# Patient Record
Sex: Female | Born: 1980 | Race: White | Hispanic: No | Marital: Married | Smoking: Former smoker
Health system: Southern US, Community
[De-identification: ages and names within clinical notes are randomized; demographics above are authoritative.]

## PROBLEM LIST (undated history)

## (undated) DIAGNOSIS — E282 Polycystic ovarian syndrome: Secondary | ICD-10-CM

## (undated) DIAGNOSIS — M545 Low back pain, unspecified: Secondary | ICD-10-CM

## (undated) HISTORY — DX: Low back pain: M54.5

## (undated) HISTORY — DX: Low back pain, unspecified: M54.50

## (undated) HISTORY — DX: Polycystic ovarian syndrome: E28.2

---

## 1998-09-21 ENCOUNTER — Other Ambulatory Visit: Admission: RE | Admit: 1998-09-21 | Discharge: 1998-09-21 | Payer: Self-pay | Admitting: Gynecology

## 2001-05-08 ENCOUNTER — Other Ambulatory Visit: Admission: RE | Admit: 2001-05-08 | Discharge: 2001-05-08 | Payer: Self-pay | Admitting: Gynecology

## 2002-06-16 ENCOUNTER — Other Ambulatory Visit: Admission: RE | Admit: 2002-06-16 | Discharge: 2002-06-16 | Payer: Self-pay | Admitting: Gynecology

## 2003-07-07 ENCOUNTER — Other Ambulatory Visit: Admission: RE | Admit: 2003-07-07 | Discharge: 2003-07-07 | Payer: Self-pay | Admitting: Gynecology

## 2005-11-12 ENCOUNTER — Other Ambulatory Visit: Admission: RE | Admit: 2005-11-12 | Discharge: 2005-11-12 | Payer: Self-pay | Admitting: Gynecology

## 2006-12-02 ENCOUNTER — Other Ambulatory Visit: Admission: RE | Admit: 2006-12-02 | Discharge: 2006-12-02 | Payer: Self-pay | Admitting: Gynecology

## 2007-08-18 ENCOUNTER — Ambulatory Visit: Payer: Self-pay | Admitting: Licensed Clinical Social Worker

## 2007-09-04 ENCOUNTER — Ambulatory Visit: Payer: Self-pay | Admitting: Licensed Clinical Social Worker

## 2007-09-09 ENCOUNTER — Ambulatory Visit: Payer: Self-pay | Admitting: Licensed Clinical Social Worker

## 2008-02-07 ENCOUNTER — Emergency Department (HOSPITAL_COMMUNITY): Admission: EM | Admit: 2008-02-07 | Discharge: 2008-02-07 | Payer: Self-pay | Admitting: Emergency Medicine

## 2013-04-06 ENCOUNTER — Telehealth: Payer: Self-pay

## 2013-04-06 NOTE — Telephone Encounter (Signed)
Patient didn't answer, so a detailed message was left about her appointment being cancelled for tomorrow - December 16 and to re-schedule for another day this week if possible.

## 2013-04-07 ENCOUNTER — Encounter: Payer: Self-pay | Admitting: Neurology

## 2013-04-07 ENCOUNTER — Institutional Professional Consult (permissible substitution): Payer: Managed Care, Other (non HMO) | Admitting: Neurology

## 2013-04-07 NOTE — Telephone Encounter (Signed)
Patient was notified and the appointment was scheduled for April 20, 2013 with Dr. Hosie Poisson.

## 2013-04-20 ENCOUNTER — Encounter (INDEPENDENT_AMBULATORY_CARE_PROVIDER_SITE_OTHER): Payer: Self-pay

## 2013-04-20 ENCOUNTER — Encounter: Payer: Self-pay | Admitting: Neurology

## 2013-04-20 ENCOUNTER — Ambulatory Visit (INDEPENDENT_AMBULATORY_CARE_PROVIDER_SITE_OTHER): Payer: Managed Care, Other (non HMO) | Admitting: Neurology

## 2013-04-20 VITALS — BP 152/92 | HR 105 | Ht 65.0 in | Wt 360.0 lb

## 2013-04-20 DIAGNOSIS — G44309 Post-traumatic headache, unspecified, not intractable: Secondary | ICD-10-CM
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
## 2013-04-20 NOTE — Patient Instructions (Signed)
Overall you are doing fairly well but I do want to suggest a few things today:   Your headaches seem to be most consistent with a post concussion headache. The good news is that these headaches typically improve over time, though it can take 3 to 6 months to fully resolve.   As far as your medications are concerned, I would like to suggest trying a supplement called Petadolex. Please take one capsule twice daily with food.   We will see you back as needed. Please call us with any interim questions, concerns, problems, updates or refill requests.   My clinical assistant and will answer any of your questions and relay your messages to me and also relay most of my messages to you.   Our phone number is [phone]. We also have an after hours call service for urgent matters and there is a physician on-call for urgent questions. For any emergencies you know to call 911 or go to the nearest emergency room

## 2013-10-12 ENCOUNTER — Ambulatory Visit (INDEPENDENT_AMBULATORY_CARE_PROVIDER_SITE_OTHER): Payer: Managed Care, Other (non HMO)

## 2013-10-12 ENCOUNTER — Ambulatory Visit (INDEPENDENT_AMBULATORY_CARE_PROVIDER_SITE_OTHER): Payer: Managed Care, Other (non HMO) | Admitting: Podiatry

## 2013-10-12 ENCOUNTER — Encounter: Payer: Self-pay | Admitting: Podiatry

## 2013-10-12 VITALS — BP 144/86 | HR 92 | Resp 16 | Ht 65.0 in | Wt 368.0 lb

## 2013-10-12 DIAGNOSIS — M722 Plantar fascial fibromatosis: Secondary | ICD-10-CM

## 2013-10-12 MED ORDER — METHYLPREDNISOLONE (PAK) 4 MG PO TABS: ORAL_TABLET | ORAL | Status: DC

## 2013-10-12 MED ORDER — MELOXICAM 15 MG PO TABS: 15.0000 mg | ORAL_TABLET | Freq: Every day | ORAL | Status: DC

## 2013-10-12 NOTE — Progress Notes (Signed)
   Subjective:    Patient ID: Kathryn Moses, female    DOB: 1980-06-07, 33 y.o.   MRN: [id number]  HPI Comments: i have pain in my left heel and a possible ingrown toenail on my left big toe. This has been going on for 9 years off and on. Its better than worse. It hurts after i sit down. i dont go bare foot, wear good shoes, and that's it.  Foot Pain Associated symptoms include headaches.      Review of Systems  Neurological: Positive for headaches.  All other systems reviewed and are negative.      Objective:   Physical Exam: I reviewed her past medical history medications allergies surgeries social history and review of systems. Pulses are palpable bilateral. Neurologic sensorium is intact per Semmes-Weinstein monofilament. Deep tendon reflexes are intact bilateral muscle strength is 5 over 5 dorsiflexors plantar flexors inverters everters all intrinsic musculature is intact. Orthopedic evaluation demonstrates all joints distal to the ankle a full range of motion without crepitation. She has pain on palpation medial calcaneal tubercle left heel. Radiographic evaluation demonstrates relatively normal osseous architecture with a plantar distally oriented calcaneal heel spur and a soft tissue increase in density at the plantar fascial calcaneal insertion site. Cutaneous evaluation demonstrates supple well hydrated cutis there is mild incurvation of the tibial border of the hallux left. No erythema edema drainage cellulitis or odor noted with this.        Assessment & Plan:  Assessment: Plantar fasciitis left.  Plan: Injected the left heel today put her in a plantar fascial strapping. Medrol Dosepak to be followed by medic was called in to her pharmacy. We discussed appropriate shoe gear stretching exercises ice therapy shoe gear modifications. She will continue use a night splint she has a time.

## 2013-11-09 ENCOUNTER — Ambulatory Visit: Payer: Managed Care, Other (non HMO) | Admitting: Podiatry

## 2017-04-22 ENCOUNTER — Ambulatory Visit (INDEPENDENT_AMBULATORY_CARE_PROVIDER_SITE_OTHER): Payer: 59 | Admitting: Urgent Care

## 2017-04-22 ENCOUNTER — Encounter: Payer: Self-pay | Admitting: Urgent Care

## 2017-04-22 VITALS — BP 140/70 | HR 89 | Temp 98.5°F | Resp 16 | Ht 65.0 in | Wt 330.6 lb

## 2017-04-22 DIAGNOSIS — R0981 Nasal congestion: Secondary | ICD-10-CM | POA: Diagnosis not present

## 2017-04-22 DIAGNOSIS — R112 Nausea with vomiting, unspecified: Secondary | ICD-10-CM

## 2017-04-22 DIAGNOSIS — J3489 Other specified disorders of nose and nasal sinuses: Secondary | ICD-10-CM | POA: Diagnosis not present

## 2017-04-22 DIAGNOSIS — E860 Dehydration: Secondary | ICD-10-CM

## 2017-04-22 DIAGNOSIS — R197 Diarrhea, unspecified: Secondary | ICD-10-CM

## 2017-04-22 LAB — POCT CBC
Granulocyte percent: 62.2 %G (ref 37–80)
HEMATOCRIT: 39.8 % (ref 37.7–47.9)
Hemoglobin: 13.2 g/dL (ref 12.2–16.2)
LYMPH, POC: 1.9 (ref 0.6–3.4)
MCH, POC: 26.6 pg — AB (ref 27–31.2)
MCHC: 33.1 g/dL (ref 31.8–35.4)
MCV: 80.1 fL (ref 80–97)
MID (cbc): 0.6 (ref 0–0.9)
MPV: 7.7 fL (ref 0–99.8)
POC GRANULOCYTE: 4.2 (ref 2–6.9)
POC LYMPH %: 28.3 % (ref 10–50)
POC MID %: 9.5 %M (ref 0–12)
Platelet Count, POC: 271 10*3/uL (ref 142–424)
RBC: 4.97 M/uL (ref 4.04–5.48)
RDW, POC: 13.8 %
WBC: 6.8 10*3/uL (ref 4.6–10.2)

## 2017-04-22 LAB — POCT URINALYSIS DIP (MANUAL ENTRY)
Blood, UA: NEGATIVE
GLUCOSE UA: NEGATIVE mg/dL
Ketones, POC UA: NEGATIVE mg/dL
Leukocytes, UA: NEGATIVE
Nitrite, UA: NEGATIVE
Spec Grav, UA: 1.02 (ref 1.010–1.025)
Urobilinogen, UA: >=8 E.U./dL — AB
pH, UA: 7 (ref 5.0–8.0)

## 2017-04-22 MED ORDER — ONDANSETRON 8 MG PO TBDP: 8.0000 mg | ORAL_TABLET | Freq: Three times a day (TID) | ORAL | 0 refills | Status: AC | PRN

## 2017-04-22 MED ORDER — SODIUM CHLORIDE 0.9 % IV SOLN
Freq: Once | INTRAVENOUS | Status: AC
  Administered 2017-04-22: 09:00:00 via INTRAVENOUS

## 2017-04-22 MED ORDER — PSEUDOEPHEDRINE HCL 60 MG PO TABS: 60.0000 mg | ORAL_TABLET | Freq: Three times a day (TID) | ORAL | 0 refills | Status: AC | PRN

## 2017-04-22 MED ORDER — ONDANSETRON HCL 4 MG/2ML IJ SOLN
4.0000 mg | Freq: Once | INTRAMUSCULAR | Status: AC
  Administered 2017-04-22: 4 mg via INTRAVENOUS

## 2017-04-22 NOTE — Progress Notes (Signed)
MRN: [id number] DOB: 1980/09/17  Subjective:   Kathryn Moses is a 36 y.o. female presenting for 3 day history of nausea with vomiting, decreased appetite, is only tolerating liquids. Had sore throat this morning from her vomiting. Has tried Pepto, [employer]met[employer]. Denies fever, abdominal pain, bloody stools or hematemesis. She also had cold symptoms. Reports 1 week history of nasal congestion, started out with sinus pain that has been improving. Denies cough, chest pain, shob. Has tried APAP cold/flu with some relief. Denies smoking cigarettes.  Kathryn Moses is not currently taking any medications and has No Known Allergies.  Kathryn Moses  has a past medical history of Low back pain and PCOS (polycystic ovarian syndrome). Denies past surgical history. Her family history includes Breast cancer in her paternal aunt and paternal grandmother; Cancer in her father, maternal aunt, maternal grandfather, paternal aunt, and paternal grandmother; Diabetes in her maternal aunt and paternal grandmother; Heart Problems in her father and maternal grandfather; Hypercholesterolemia in her father, maternal grandmother, and mother; Other in her father.   Objective:   Vitals: BP 140/70   Pulse 89   Temp 98.5 F (36.9 C) (Oral)   Resp 16   Ht 5\' 5"  (1.651 m)   Wt (!) 330 lb 9.6 oz (150 kg)   SpO2 97%   BMI 55.01 kg/m   Physical Exam  Constitutional: She is oriented to person, place, and time. She appears well-developed and well-nourished.  HENT:  Mouth/Throat: Oropharynx is clear and moist.  Mucous membranes dry.  Eyes: No scleral icterus.  Cardiovascular: Normal rate, regular rhythm and intact distal pulses. Exam reveals no gallop and no friction rub.  No murmur heard. Pulmonary/Chest: No respiratory distress. She has no wheezes. She has no rales.  Abdominal: Soft. Bowel sounds are normal. She exhibits no distension and no mass. There is no tenderness. There is no guarding.  Neurological: She is alert and oriented  to person, place, and time.  Skin: Skin is warm and dry.   Results for orders placed or performed in visit on 04/22/17 (from the past 24 hour(s))  POCT urinalysis dipstick     Status: Abnormal   Collection Time: 04/22/17  8:55 AM  Result Value Ref Range   Color, UA yellow yellow   Clarity, UA clear clear   Glucose, UA negative negative mg/dL   Bilirubin, UA small (A) negative   Ketones, POC UA negative negative mg/dL   Spec Grav, UA 4.0981.020 1.1911.010 - 1.025   Blood, UA negative negative   pH, UA 7.0 5.0 - 8.0   Protein Ur, POC trace (A) negative mg/dL   Urobilinogen, UA >=4.7>=8.0 (A) 0.2 or 1.0 E.U./dL   Nitrite, UA Negative Negative   Leukocytes, UA Negative Negative  POCT CBC     Status: Abnormal   Collection Time: 04/22/17  9:46 AM  Result Value Ref Range   WBC 6.8 4.6 - 10.2 K/uL   Lymph, poc 1.9 0.6 - 3.4   POC LYMPH PERCENT 28.3 10 - 50 %L   MID (cbc) 0.6 0 - 0.9   POC MID % 9.5 0 - 12 %M   POC Granulocyte 4.2 2 - 6.9   Granulocyte percent 62.2 37 - 80 %G   RBC 4.97 4.04 - 5.48 M/uL   Hemoglobin 13.2 12.2 - 16.2 g/dL   HCT, POC 82.939.8 56.237.7 - 47.9 %   MCV 80.1 80 - 97 fL   MCH, POC 26.6 (A) 27 - 31.2 pg   MCHC 33.1 31.8 - 35.4 g/dL  RDW, POC 13.8 %   Platelet Count, POC 271 142 - 424 K/uL   MPV 7.7 0 - 99.8 fL   Assessment and Plan :   Dehydration  Diarrhea, unspecified type - Plan: POCT urinalysis dipstick, Basic metabolic panel, 0.9 %  sodium chloride infusion  Nausea and vomiting, intractability of vomiting not specified, unspecified vomiting type - Plan: Basic metabolic panel, 0.9 %  sodium chloride infusion, ondansetron (ZOFRAN) injection 4 mg  Sinus pain - Plan: POCT CBC  Sinus congestion  Will start supportive care for her gastroenteritis and what I suspect is a concurrent viral URI. Return-to-clinic precautions discussed, patient verbalized understanding.   Wallis BambergMario Broadus Costilla, PA-C Primary Care at [facility] [phone] 04/22/2017  8:43  AM

## 2017-04-22 NOTE — Patient Instructions (Addendum)
Viral Gastroenteritis, Adult Viral gastroenteritis is also known as the stomach flu. This condition is caused by certain germs (viruses). These germs can be passed from person to person very easily (are very contagious). This condition can cause sudden watery poop (diarrhea), fever, and throwing up (vomiting). Having watery poop and throwing up can make you feel weak and cause you to get dehydrated. Dehydration can make you tired and thirsty, make you have a dry mouth, and make it so you pee (urinate) less often. Older adults and people with other diseases or a weak defense system (immune system) are at higher risk for dehydration. It is important to replace the fluids that you lose from having watery poop and throwing up. Follow these instructions at home: Follow instructions from your doctor about how to care for yourself at home. Eating and drinking  Follow these instructions as told by your doctor:  Take an oral rehydration solution (ORS). This is a drink that is sold at pharmacies and stores.  Drink clear fluids in small amounts as you are able, such as: ? Water. ? Ice chips. ? Diluted fruit juice. ? Low-calorie sports drinks.  Eat bland, easy-to-digest foods in small amounts as you are able, such as: ? Bananas. ? Applesauce. ? Rice. ? Low-fat (lean) meats. ? Toast. ? Crackers.  Avoid fluids that have a lot of sugar or caffeine in them.  Avoid alcohol.  Avoid spicy or fatty foods.  General instructions  Drink enough fluid to keep your pee (urine) clear or pale yellow.  Wash your hands often. If you cannot use soap and water, use hand sanitizer.  Make sure that all people in your home wash their hands well and often.  Rest at home while you get better.  Take over-the-counter and prescription medicines only as told by your doctor.  Watch your condition for any changes.  Take a warm bath to help with any burning or pain from having watery poop.  Keep all follow-up  visits as told by your doctor. This is important. Contact a doctor if:  You cannot keep fluids down.  Your symptoms get worse.  You have new symptoms.  You feel light-headed or dizzy.  You have muscle cramps. Get help right away if:  You have chest pain.  You feel very weak or you pass out (faint).  You see blood in your throw-up.  Your throw-up looks like coffee grounds.  You have bloody or black poop (stools) or poop that look like tar.  You have a very bad headache, a stiff neck, or both.  You have a rash.  You have very bad pain, cramping, or bloating in your belly (abdomen).  You have trouble breathing.  You are breathing very quickly.  Your heart is beating very quickly.  Your skin feels cold and clammy.  You feel confused.  You have pain when you pee.  You have signs of dehydration, such as: ? Dark pee, hardly any pee, or no pee. ? Cracked lips. ? Dry mouth. ? Sunken eyes. ? Sleepiness. ? Weakness. This information is not intended to replace advice given to you by your health care provider. Make sure you discuss any questions you have with your health care provider. Document Released: 09/26/2007 Document Revised: 10/28/2015 Document Reviewed: 12/14/2014 Elsevier Interactive Patient Education  2017 Elsevier Inc.     IF you received an x-ray today, you will receive an invoice from [facility]. Please contact [facility] at [phone] with questions or concerns regarding   your invoice.   IF you received labwork today, you will receive an invoice from [facility]. Please contact [facility] at [phone] with questions or concerns regarding your invoice.   Our billing staff will not be able to assist you with questions regarding bills from these companies.  You will be contacted with the lab results as soon as they are available. The fastest way to get your results is to activate your My Chart account. Instructions are located on the  last page of this paperwork. If you have not heard from us regarding the results in 2 weeks, please contact this office.     

## 2017-04-23 LAB — BASIC METABOLIC PANEL
BUN / CREAT RATIO: 15 (ref 9–23)
BUN: 9 mg/dL (ref 6–20)
CO2: 19 mmol/L — ABNORMAL LOW (ref 20–29)
CREATININE: 0.6 mg/dL (ref 0.57–1.00)
Calcium: 8 mg/dL — ABNORMAL LOW (ref 8.7–10.2)
Chloride: 103 mmol/L (ref 96–106)
GFR, EST AFRICAN AMERICAN: 134 mL/min/{1.73_m2} (ref >59)
GFR, EST NON AFRICAN AMERICAN: 116 mL/min/{1.73_m2} (ref >59)
Glucose: 75 mg/dL (ref 65–99)
Potassium: 4 mmol/L (ref 3.5–5.2)
SODIUM: 140 mmol/L (ref 134–144)

## 2018-12-24 DIAGNOSIS — R1032 Left lower quadrant pain: Secondary | ICD-10-CM | POA: Diagnosis not present

## 2020-09-28 ENCOUNTER — Other Ambulatory Visit (HOSPITAL_COMMUNITY): Payer: Self-pay | Admitting: Family Medicine

## 2020-09-28 DIAGNOSIS — R1084 Generalized abdominal pain: Secondary | ICD-10-CM

## 2020-09-28 DIAGNOSIS — M25561 Pain in right knee: Secondary | ICD-10-CM | POA: Diagnosis not present

## 2020-10-05 ENCOUNTER — Ambulatory Visit (HOSPITAL_BASED_OUTPATIENT_CLINIC_OR_DEPARTMENT_OTHER)
Admission: RE | Admit: 2020-10-05 | Discharge: 2020-10-05 | Disposition: A | Discharge status: Home / Self Care | Payer: BC Managed Care – PPO | Source: Ambulatory Visit | Attending: Family Medicine | Admitting: Family Medicine

## 2020-10-05 ENCOUNTER — Other Ambulatory Visit: Payer: Self-pay

## 2020-10-05 DIAGNOSIS — R1084 Generalized abdominal pain: Secondary | ICD-10-CM | POA: Diagnosis not present

## 2020-10-05 DIAGNOSIS — K8020 Calculus of gallbladder without cholecystitis without obstruction: Secondary | ICD-10-CM | POA: Diagnosis not present

## 2021-03-08 DIAGNOSIS — R635 Abnormal weight gain: Secondary | ICD-10-CM | POA: Diagnosis not present

## 2021-03-08 DIAGNOSIS — E282 Polycystic ovarian syndrome: Secondary | ICD-10-CM | POA: Diagnosis not present

## 2021-03-08 DIAGNOSIS — R5383 Other fatigue: Secondary | ICD-10-CM | POA: Diagnosis not present

## 2021-03-14 DIAGNOSIS — N926 Irregular menstruation, unspecified: Secondary | ICD-10-CM | POA: Diagnosis not present

## 2021-03-14 DIAGNOSIS — L918 Other hypertrophic disorders of the skin: Secondary | ICD-10-CM | POA: Diagnosis not present

## 2021-03-14 DIAGNOSIS — Z6841 Body Mass Index (BMI) 40.0 and over, adult: Secondary | ICD-10-CM | POA: Diagnosis not present

## 2021-03-14 DIAGNOSIS — E282 Polycystic ovarian syndrome: Secondary | ICD-10-CM | POA: Diagnosis not present

## 2021-03-14 DIAGNOSIS — Z1231 Encounter for screening mammogram for malignant neoplasm of breast: Secondary | ICD-10-CM | POA: Diagnosis not present

## 2021-03-14 DIAGNOSIS — Z01419 Encounter for gynecological examination (general) (routine) without abnormal findings: Secondary | ICD-10-CM | POA: Diagnosis not present

## 2021-05-03 DIAGNOSIS — Z803 Family history of malignant neoplasm of breast: Secondary | ICD-10-CM | POA: Diagnosis not present

## 2021-08-24 IMAGING — US US ABDOMEN COMPLETE
1 series · 14 of 25 positions shown · non-contrast
Comparison: None.

CLINICAL DATA: Generalized abdominal pain

EXAM:
ABDOMEN ULTRASOUND COMPLETE

[Series 1: us abdomen complete · 14 of 85 slices shown]
[im 1/85]
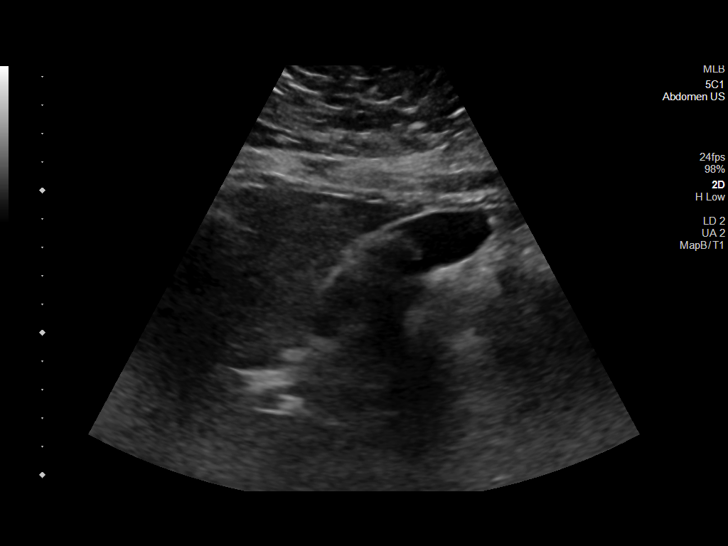
[im 8/85]
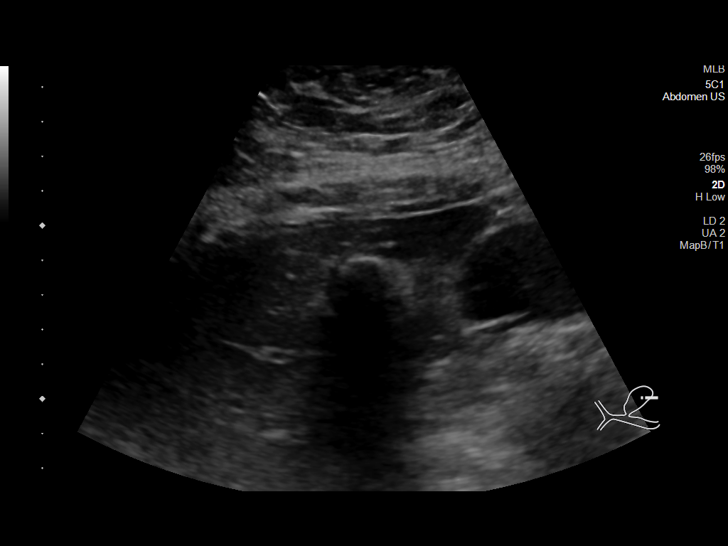
[im 15/85]
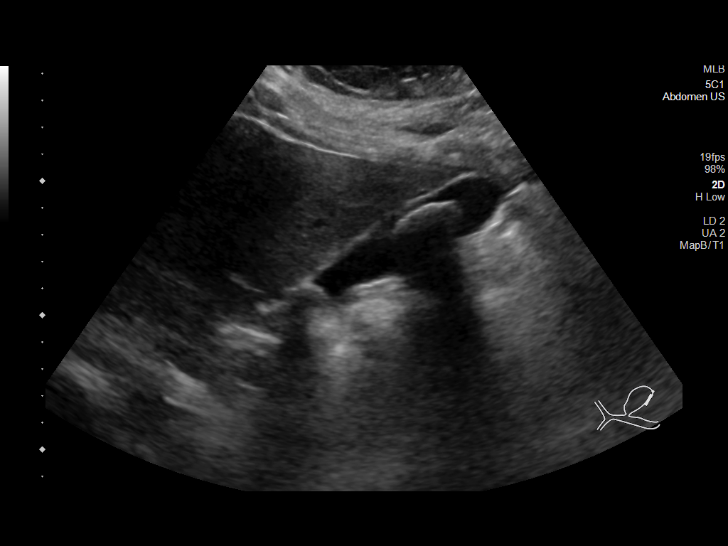
[im 22/85]
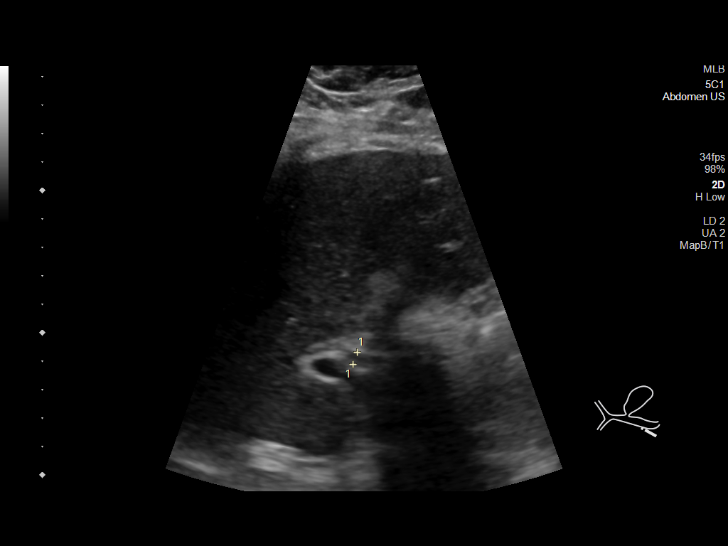
[im 29/85]
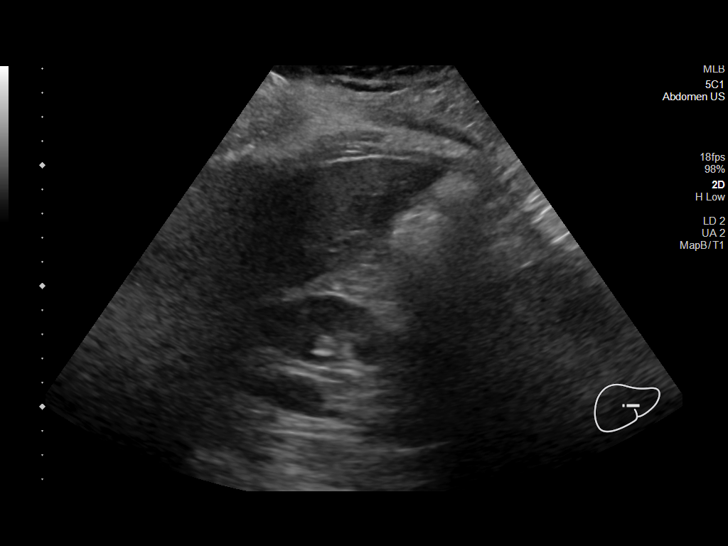
[im 32/85]
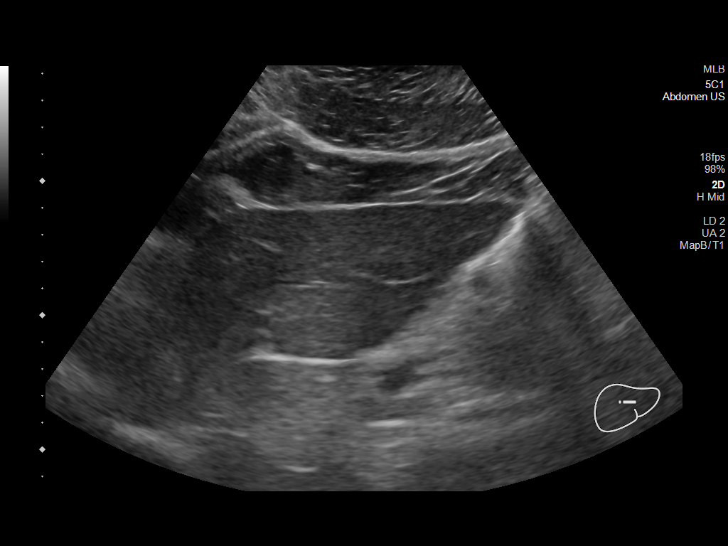
[im 39/85]
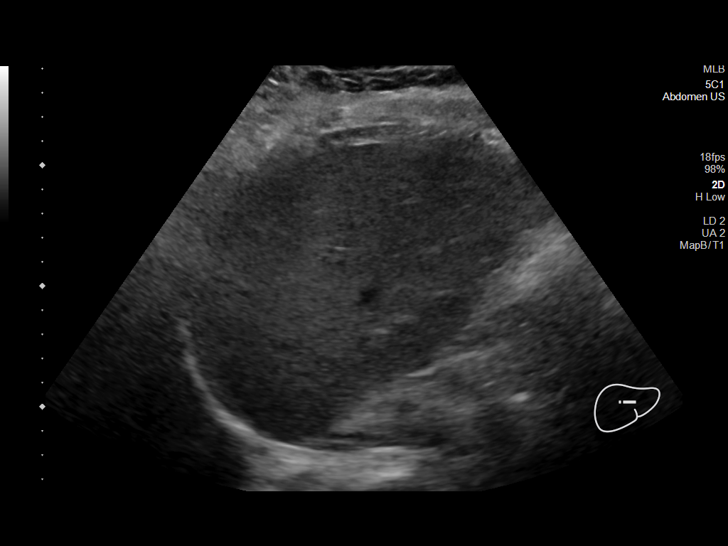
[im 46/85]
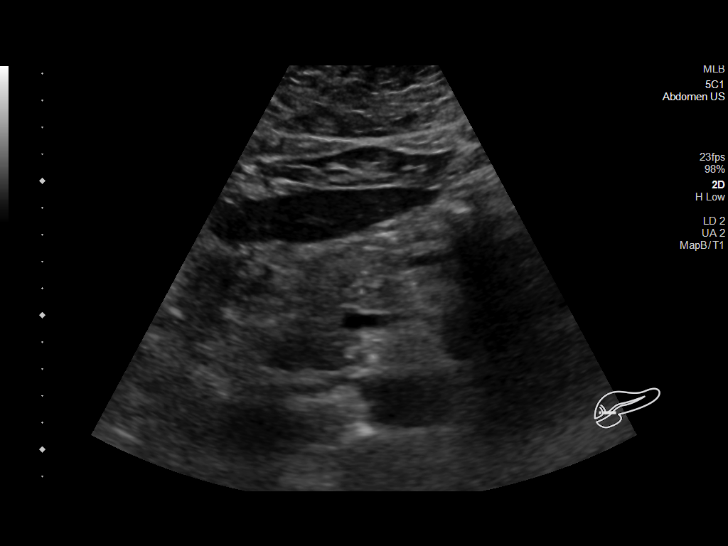
[im 53/85]
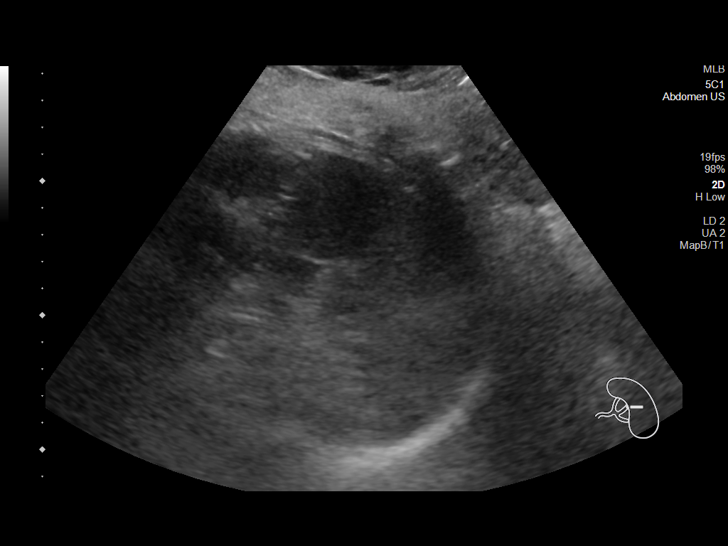
[im 57/85]
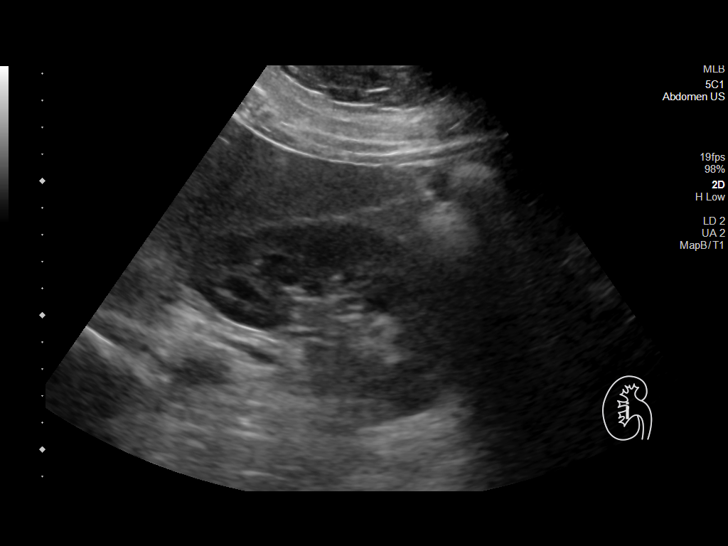
[im 64/85]
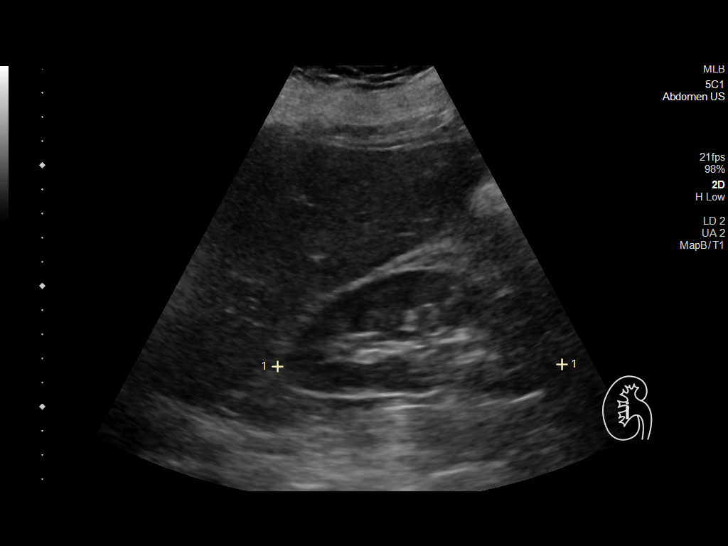
[im 71/85]
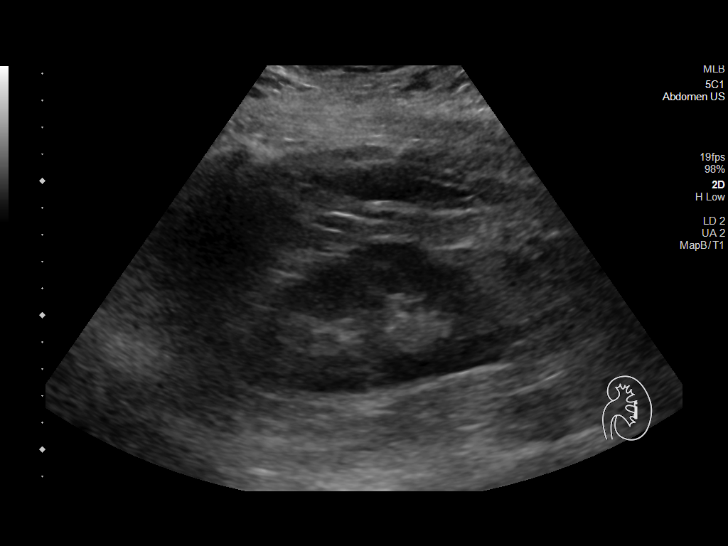
[im 78/85]
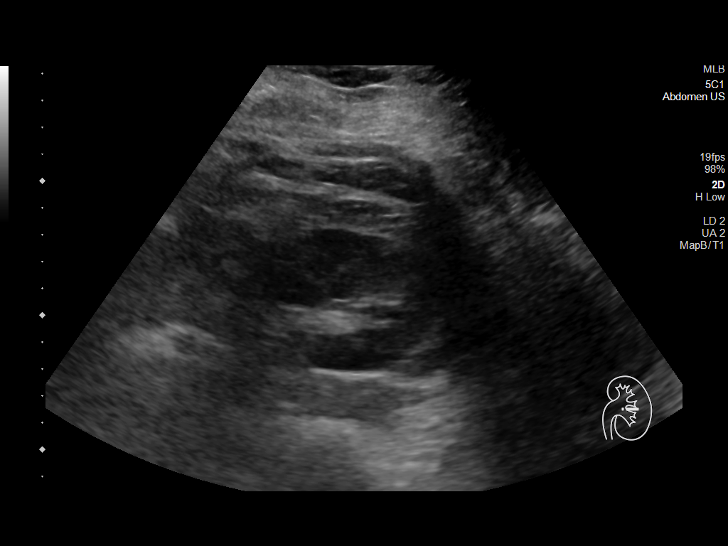
[im 85/85]
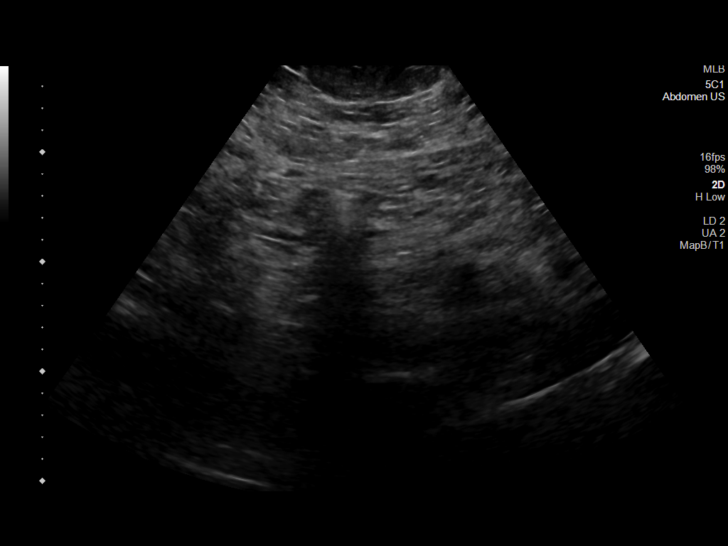

[14 of 25 positions shown; findings below may reference images not displayed]

FINDINGS: Gallbladder: Shadowing stone measuring up to 2.1 cm. Normal wall
thickness. Negative sonographic Murphy.

Common bile duct: Diameter: 4.5 mm

Liver: No focal lesion identified. Within normal limits in
parenchymal echogenicity. Portal vein is patent on color Doppler
imaging with normal direction of blood flow towards the liver.

IVC: No abnormality visualized.

Pancreas: Visualized portion unremarkable.

Spleen: Size and appearance within normal limits.

Right Kidney: Length: 11.8 cm. Echogenicity within normal limits. No
mass or hydronephrosis visualized.

Left Kidney: Length: 11 cm. Echogenicity within normal limits. No
mass or hydronephrosis visualized.

Abdominal aorta: No aneurysm visualized.

Other findings: None.
IMPRESSION: Cholelithiasis without sonographic evidence for acute cholecystitis.

## 2022-10-02 DIAGNOSIS — R051 Acute cough: Secondary | ICD-10-CM | POA: Diagnosis not present

## 2022-10-02 DIAGNOSIS — J302 Other seasonal allergic rhinitis: Secondary | ICD-10-CM | POA: Diagnosis not present

## 2022-10-02 DIAGNOSIS — J069 Acute upper respiratory infection, unspecified: Secondary | ICD-10-CM | POA: Diagnosis not present

## 2022-10-17 DIAGNOSIS — Z1231 Encounter for screening mammogram for malignant neoplasm of breast: Secondary | ICD-10-CM | POA: Diagnosis not present

## 2022-10-17 DIAGNOSIS — Z6841 Body Mass Index (BMI) 40.0 and over, adult: Secondary | ICD-10-CM | POA: Diagnosis not present

## 2022-10-17 DIAGNOSIS — N898 Other specified noninflammatory disorders of vagina: Secondary | ICD-10-CM | POA: Diagnosis not present

## 2022-10-17 DIAGNOSIS — Z01419 Encounter for gynecological examination (general) (routine) without abnormal findings: Secondary | ICD-10-CM | POA: Diagnosis not present

## 2022-11-21 DIAGNOSIS — N926 Irregular menstruation, unspecified: Secondary | ICD-10-CM | POA: Diagnosis not present

## 2022-11-21 DIAGNOSIS — N939 Abnormal uterine and vaginal bleeding, unspecified: Secondary | ICD-10-CM | POA: Diagnosis not present

## 2022-11-27 DIAGNOSIS — Z30017 Encounter for initial prescription of implantable subdermal contraceptive: Secondary | ICD-10-CM | POA: Diagnosis not present

## 2022-12-27 DIAGNOSIS — M62838 Other muscle spasm: Secondary | ICD-10-CM | POA: Diagnosis not present

## 2023-01-15 DIAGNOSIS — E282 Polycystic ovarian syndrome: Secondary | ICD-10-CM | POA: Diagnosis not present

## 2023-01-15 DIAGNOSIS — Z23 Encounter for immunization: Secondary | ICD-10-CM | POA: Diagnosis not present

## 2023-01-15 DIAGNOSIS — J309 Allergic rhinitis, unspecified: Secondary | ICD-10-CM | POA: Diagnosis not present

## 2023-01-15 DIAGNOSIS — Z0000 Encounter for general adult medical examination without abnormal findings: Secondary | ICD-10-CM | POA: Diagnosis not present

## 2023-02-07 ENCOUNTER — Other Ambulatory Visit (HOSPITAL_COMMUNITY): Payer: Self-pay

## 2023-02-07 ENCOUNTER — Other Ambulatory Visit: Payer: Self-pay

## 2023-02-07 MED ORDER — SEMAGLUTIDE-WEIGHT MANAGEMENT 0.25 MG/0.5ML ~~LOC~~ SOAJ
0.2500 mg | SUBCUTANEOUS | 1 refills | Status: AC
  Filled 2023-02-07: qty 2, 28d supply, fill #0

## 2023-02-08 ENCOUNTER — Other Ambulatory Visit (HOSPITAL_COMMUNITY): Payer: Self-pay

## 2023-03-05 ENCOUNTER — Other Ambulatory Visit (HOSPITAL_COMMUNITY): Payer: Self-pay

## 2023-03-05 MED ORDER — WEGOVY 0.5 MG/0.5ML ~~LOC~~ SOAJ
0.5000 mg | SUBCUTANEOUS | 0 refills | Status: AC
  Filled 2023-03-05: qty 2, 28d supply, fill #0

## 2023-03-07 DIAGNOSIS — N390 Urinary tract infection, site not specified: Secondary | ICD-10-CM | POA: Diagnosis not present

## 2023-04-15 ENCOUNTER — Other Ambulatory Visit (HOSPITAL_COMMUNITY): Payer: Self-pay

## 2023-04-15 MED ORDER — WEGOVY 1 MG/0.5ML ~~LOC~~ SOAJ
1.0000 mg | SUBCUTANEOUS | 0 refills | Status: AC
  Filled 2023-04-15: qty 2, 28d supply, fill #0

## 2023-05-14 ENCOUNTER — Other Ambulatory Visit (HOSPITAL_COMMUNITY): Payer: Self-pay

## 2023-05-14 MED ORDER — WEGOVY 1.7 MG/0.75ML ~~LOC~~ SOAJ
1.7000 mg | SUBCUTANEOUS | 0 refills | Status: AC
  Filled 2023-05-14: qty 3, 28d supply, fill #0

## 2023-06-11 ENCOUNTER — Other Ambulatory Visit (HOSPITAL_COMMUNITY): Payer: Self-pay

## 2023-06-11 MED ORDER — WEGOVY 2.4 MG/0.75ML ~~LOC~~ SOAJ
2.4000 mg | SUBCUTANEOUS | 1 refills | Status: AC
  Filled 2023-06-11: qty 3, 28d supply, fill #0

## 2023-07-10 ENCOUNTER — Other Ambulatory Visit (HOSPITAL_COMMUNITY): Payer: Self-pay

## 2023-07-10 MED ORDER — WEGOVY 2.4 MG/0.75ML ~~LOC~~ SOAJ
2.4000 mg | SUBCUTANEOUS | 0 refills | Status: AC
  Filled 2023-07-10: qty 3, 28d supply, fill #0

## 2023-07-23 ENCOUNTER — Other Ambulatory Visit (HOSPITAL_COMMUNITY): Payer: Self-pay

## 2023-07-23 DIAGNOSIS — Z6841 Body Mass Index (BMI) 40.0 and over, adult: Secondary | ICD-10-CM | POA: Diagnosis not present

## 2023-07-23 DIAGNOSIS — E66813 Obesity, class 3: Secondary | ICD-10-CM | POA: Diagnosis not present

## 2023-07-23 DIAGNOSIS — G8929 Other chronic pain: Secondary | ICD-10-CM | POA: Diagnosis not present

## 2023-07-23 MED ORDER — CYCLOBENZAPRINE HCL 5 MG PO TABS
5.0000 mg | ORAL_TABLET | Freq: Every evening | ORAL | 0 refills | Status: AC | PRN
  Filled 2023-07-23: qty 30, 30d supply, fill #0

## 2023-08-21 ENCOUNTER — Other Ambulatory Visit (HOSPITAL_COMMUNITY): Payer: Self-pay

## 2023-08-21 DIAGNOSIS — F411 Generalized anxiety disorder: Secondary | ICD-10-CM | POA: Diagnosis not present

## 2023-08-21 DIAGNOSIS — E8889 Other specified metabolic disorders: Secondary | ICD-10-CM | POA: Diagnosis not present

## 2023-08-21 DIAGNOSIS — E559 Vitamin D deficiency, unspecified: Secondary | ICD-10-CM | POA: Diagnosis not present

## 2023-08-21 DIAGNOSIS — E282 Polycystic ovarian syndrome: Secondary | ICD-10-CM | POA: Diagnosis not present

## 2023-08-21 DIAGNOSIS — Z1331 Encounter for screening for depression: Secondary | ICD-10-CM | POA: Diagnosis not present

## 2023-08-21 DIAGNOSIS — Z9189 Other specified personal risk factors, not elsewhere classified: Secondary | ICD-10-CM | POA: Diagnosis not present

## 2023-08-21 MED ORDER — ZEPBOUND 2.5 MG/0.5ML ~~LOC~~ SOAJ
2.5000 mg | SUBCUTANEOUS | 0 refills | Status: DC
  Filled 2023-08-21 – 2023-08-28 (×2): qty 2, 28d supply, fill #0

## 2023-08-21 MED ORDER — VITAMIN D (ERGOCALCIFEROL) 1.25 MG (50000 UNIT) PO CAPS
50000.0000 [IU] | ORAL_CAPSULE | ORAL | 2 refills | Status: AC
  Filled 2023-08-21: qty 4, 28d supply, fill #0
  Filled 2023-12-18: qty 4, 28d supply, fill #1
  Filled 2024-02-13: qty 4, 28d supply, fill #2

## 2023-08-28 ENCOUNTER — Other Ambulatory Visit (HOSPITAL_COMMUNITY): Payer: Self-pay

## 2023-09-03 DIAGNOSIS — E282 Polycystic ovarian syndrome: Secondary | ICD-10-CM | POA: Diagnosis not present

## 2023-09-03 DIAGNOSIS — F411 Generalized anxiety disorder: Secondary | ICD-10-CM | POA: Diagnosis not present

## 2023-09-03 DIAGNOSIS — E559 Vitamin D deficiency, unspecified: Secondary | ICD-10-CM | POA: Diagnosis not present

## 2023-09-03 DIAGNOSIS — Z9189 Other specified personal risk factors, not elsewhere classified: Secondary | ICD-10-CM | POA: Diagnosis not present

## 2023-09-03 DIAGNOSIS — E66813 Obesity, class 3: Secondary | ICD-10-CM | POA: Diagnosis not present

## 2023-09-26 ENCOUNTER — Other Ambulatory Visit (HOSPITAL_COMMUNITY): Payer: Self-pay

## 2023-09-26 MED ORDER — ZEPBOUND 2.5 MG/0.5ML ~~LOC~~ SOAJ
2.5000 mg | SUBCUTANEOUS | 0 refills | Status: AC
  Filled 2023-09-26: qty 2, 28d supply, fill #0

## 2023-10-01 ENCOUNTER — Other Ambulatory Visit (HOSPITAL_COMMUNITY): Payer: Self-pay

## 2023-10-01 DIAGNOSIS — E282 Polycystic ovarian syndrome: Secondary | ICD-10-CM | POA: Diagnosis not present

## 2023-10-01 DIAGNOSIS — E559 Vitamin D deficiency, unspecified: Secondary | ICD-10-CM | POA: Diagnosis not present

## 2023-10-01 DIAGNOSIS — Z9189 Other specified personal risk factors, not elsewhere classified: Secondary | ICD-10-CM | POA: Diagnosis not present

## 2023-10-01 DIAGNOSIS — E66813 Obesity, class 3: Secondary | ICD-10-CM | POA: Diagnosis not present

## 2023-10-01 MED ORDER — ZEPBOUND 5 MG/0.5ML ~~LOC~~ SOAJ
5.0000 mg | SUBCUTANEOUS | 0 refills | Status: AC
  Filled 2023-10-01 – 2023-11-20 (×2): qty 2, 28d supply, fill #0

## 2023-10-07 ENCOUNTER — Other Ambulatory Visit (HOSPITAL_COMMUNITY): Payer: Self-pay

## 2023-10-24 ENCOUNTER — Other Ambulatory Visit (HOSPITAL_COMMUNITY): Payer: Self-pay

## 2023-10-24 MED ORDER — ZEPBOUND 5 MG/0.5ML ~~LOC~~ SOAJ
5.0000 mg | SUBCUTANEOUS | 0 refills | Status: AC
  Filled 2023-10-24: qty 2, 28d supply, fill #0

## 2023-10-29 ENCOUNTER — Other Ambulatory Visit (HOSPITAL_COMMUNITY): Payer: Self-pay

## 2023-10-29 DIAGNOSIS — E559 Vitamin D deficiency, unspecified: Secondary | ICD-10-CM | POA: Diagnosis not present

## 2023-10-29 DIAGNOSIS — E282 Polycystic ovarian syndrome: Secondary | ICD-10-CM | POA: Diagnosis not present

## 2023-10-29 DIAGNOSIS — E66813 Obesity, class 3: Secondary | ICD-10-CM | POA: Diagnosis not present

## 2023-10-29 MED ORDER — ZEPBOUND 5 MG/0.5ML ~~LOC~~ SOAJ
5.0000 mg | SUBCUTANEOUS | 0 refills | Status: AC
  Filled 2023-10-29 – 2023-11-14 (×2): qty 2, 28d supply, fill #0

## 2023-11-14 ENCOUNTER — Other Ambulatory Visit (HOSPITAL_COMMUNITY): Payer: Self-pay

## 2023-11-20 ENCOUNTER — Other Ambulatory Visit (HOSPITAL_COMMUNITY): Payer: Self-pay

## 2023-12-02 ENCOUNTER — Other Ambulatory Visit (HOSPITAL_COMMUNITY): Payer: Self-pay

## 2023-12-02 DIAGNOSIS — E559 Vitamin D deficiency, unspecified: Secondary | ICD-10-CM | POA: Diagnosis not present

## 2023-12-02 DIAGNOSIS — E282 Polycystic ovarian syndrome: Secondary | ICD-10-CM | POA: Diagnosis not present

## 2023-12-02 DIAGNOSIS — E66813 Obesity, class 3: Secondary | ICD-10-CM | POA: Diagnosis not present

## 2023-12-02 MED ORDER — ZEPBOUND 5 MG/0.5ML ~~LOC~~ SOAJ
5.0000 mg | SUBCUTANEOUS | 1 refills | Status: AC
  Filled 2023-12-02 – 2023-12-18 (×2): qty 2, 28d supply, fill #0

## 2023-12-18 ENCOUNTER — Other Ambulatory Visit (HOSPITAL_COMMUNITY): Payer: Self-pay

## 2023-12-24 ENCOUNTER — Other Ambulatory Visit (HOSPITAL_COMMUNITY): Payer: Self-pay

## 2024-01-09 ENCOUNTER — Other Ambulatory Visit (HOSPITAL_COMMUNITY): Payer: Self-pay

## 2024-01-09 DIAGNOSIS — E282 Polycystic ovarian syndrome: Secondary | ICD-10-CM | POA: Diagnosis not present

## 2024-01-09 DIAGNOSIS — E66813 Obesity, class 3: Secondary | ICD-10-CM | POA: Diagnosis not present

## 2024-01-09 DIAGNOSIS — E559 Vitamin D deficiency, unspecified: Secondary | ICD-10-CM | POA: Diagnosis not present

## 2024-01-09 MED ORDER — ZEPBOUND 7.5 MG/0.5ML ~~LOC~~ SOAJ
7.5000 mg | SUBCUTANEOUS | 3 refills | Status: AC
  Filled 2024-01-09 – 2024-01-21 (×2): qty 2, 28d supply, fill #0
  Filled 2024-02-13: qty 2, 28d supply, fill #1

## 2024-01-14 DIAGNOSIS — M25461 Effusion, right knee: Secondary | ICD-10-CM | POA: Diagnosis not present

## 2024-01-14 DIAGNOSIS — Z0000 Encounter for general adult medical examination without abnormal findings: Secondary | ICD-10-CM | POA: Diagnosis not present

## 2024-01-14 DIAGNOSIS — M25561 Pain in right knee: Secondary | ICD-10-CM | POA: Diagnosis not present

## 2024-01-14 DIAGNOSIS — M1711 Unilateral primary osteoarthritis, right knee: Secondary | ICD-10-CM | POA: Diagnosis not present

## 2024-01-14 DIAGNOSIS — R7303 Prediabetes: Secondary | ICD-10-CM | POA: Diagnosis not present

## 2024-01-14 DIAGNOSIS — Z23 Encounter for immunization: Secondary | ICD-10-CM | POA: Diagnosis not present

## 2024-01-14 DIAGNOSIS — Z13220 Encounter for screening for lipoid disorders: Secondary | ICD-10-CM | POA: Diagnosis not present

## 2024-01-14 DIAGNOSIS — E559 Vitamin D deficiency, unspecified: Secondary | ICD-10-CM | POA: Diagnosis not present

## 2024-01-20 ENCOUNTER — Other Ambulatory Visit (HOSPITAL_COMMUNITY): Payer: Self-pay

## 2024-01-21 ENCOUNTER — Other Ambulatory Visit (HOSPITAL_COMMUNITY): Payer: Self-pay

## 2024-03-18 ENCOUNTER — Other Ambulatory Visit (HOSPITAL_COMMUNITY): Payer: Self-pay

## 2024-03-18 MED ORDER — ZEPBOUND 10 MG/0.5ML ~~LOC~~ SOAJ
10.0000 mg | SUBCUTANEOUS | 1 refills | Status: AC
  Filled 2024-03-18: qty 2, 28d supply, fill #0
  Filled 2024-04-12: qty 2, 28d supply, fill #1
